# Patient Record
Sex: Male | Born: 1995 | Race: White | Hispanic: No | Marital: Single
Health system: Southern US, Community
[De-identification: ages and names within clinical notes are randomized; demographics above are authoritative.]

## PROBLEM LIST (undated history)

## (undated) DIAGNOSIS — J45909 Unspecified asthma, uncomplicated: Secondary | ICD-10-CM

## (undated) HISTORY — PX: ELBOW SURGERY: SHX618

## (undated) HISTORY — PX: OTHER SURGICAL HISTORY: SHX169

---
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
## 2016-01-02 ENCOUNTER — Ambulatory Visit (INDEPENDENT_AMBULATORY_CARE_PROVIDER_SITE_OTHER): Payer: 59 | Admitting: Psychology

## 2016-01-02 DIAGNOSIS — F321 Major depressive disorder, single episode, moderate: Secondary | ICD-10-CM | POA: Diagnosis not present

## 2016-01-02 DIAGNOSIS — F411 Generalized anxiety disorder: Secondary | ICD-10-CM

## 2016-01-23 ENCOUNTER — Ambulatory Visit (INDEPENDENT_AMBULATORY_CARE_PROVIDER_SITE_OTHER): Payer: 59 | Admitting: Psychology

## 2016-01-23 DIAGNOSIS — F321 Major depressive disorder, single episode, moderate: Secondary | ICD-10-CM

## 2016-01-23 DIAGNOSIS — F900 Attention-deficit hyperactivity disorder, predominantly inattentive type: Secondary | ICD-10-CM | POA: Diagnosis not present

## 2016-01-23 DIAGNOSIS — F411 Generalized anxiety disorder: Secondary | ICD-10-CM | POA: Diagnosis not present

## 2016-02-16 ENCOUNTER — Ambulatory Visit (INDEPENDENT_AMBULATORY_CARE_PROVIDER_SITE_OTHER): Payer: 59 | Admitting: Psychology

## 2016-02-16 DIAGNOSIS — F411 Generalized anxiety disorder: Secondary | ICD-10-CM

## 2016-02-16 DIAGNOSIS — F1210 Cannabis abuse, uncomplicated: Secondary | ICD-10-CM

## 2016-02-16 DIAGNOSIS — F902 Attention-deficit hyperactivity disorder, combined type: Secondary | ICD-10-CM

## 2016-02-16 DIAGNOSIS — F324 Major depressive disorder, single episode, in partial remission: Secondary | ICD-10-CM | POA: Diagnosis not present

## 2016-03-01 ENCOUNTER — Ambulatory Visit (INDEPENDENT_AMBULATORY_CARE_PROVIDER_SITE_OTHER): Payer: 59 | Admitting: Psychology

## 2016-03-01 DIAGNOSIS — F1210 Cannabis abuse, uncomplicated: Secondary | ICD-10-CM | POA: Diagnosis not present

## 2016-03-01 DIAGNOSIS — F902 Attention-deficit hyperactivity disorder, combined type: Secondary | ICD-10-CM

## 2016-03-01 DIAGNOSIS — F410 Panic disorder [episodic paroxysmal anxiety] without agoraphobia: Secondary | ICD-10-CM | POA: Diagnosis not present

## 2016-03-01 DIAGNOSIS — F324 Major depressive disorder, single episode, in partial remission: Secondary | ICD-10-CM | POA: Diagnosis not present

## 2016-03-29 ENCOUNTER — Ambulatory Visit: Payer: 59 | Admitting: Psychology

## 2016-04-02 ENCOUNTER — Ambulatory Visit (INDEPENDENT_AMBULATORY_CARE_PROVIDER_SITE_OTHER): Payer: 59 | Admitting: Psychology

## 2016-04-02 DIAGNOSIS — F410 Panic disorder [episodic paroxysmal anxiety] without agoraphobia: Secondary | ICD-10-CM

## 2016-04-02 DIAGNOSIS — F902 Attention-deficit hyperactivity disorder, combined type: Secondary | ICD-10-CM

## 2016-04-02 DIAGNOSIS — F1210 Cannabis abuse, uncomplicated: Secondary | ICD-10-CM | POA: Diagnosis not present

## 2016-04-16 ENCOUNTER — Ambulatory Visit (INDEPENDENT_AMBULATORY_CARE_PROVIDER_SITE_OTHER): Payer: 59 | Admitting: Psychology

## 2016-04-16 DIAGNOSIS — F410 Panic disorder [episodic paroxysmal anxiety] without agoraphobia: Secondary | ICD-10-CM | POA: Diagnosis not present

## 2016-04-16 DIAGNOSIS — F902 Attention-deficit hyperactivity disorder, combined type: Secondary | ICD-10-CM | POA: Diagnosis not present

## 2016-04-16 DIAGNOSIS — F324 Major depressive disorder, single episode, in partial remission: Secondary | ICD-10-CM

## 2016-05-06 ENCOUNTER — Ambulatory Visit: Payer: 59 | Admitting: Psychology

## 2017-05-04 IMAGING — US US ABDOMEN COMPLETE
1 series · 14 of 25 positions shown · non-contrast
Comparison: None.

CLINICAL DATA: Nausea, vomiting and abdominal pain.

EXAM:
ULTRASOUND ABDOMEN COMPLETE

[Series 1: us abdomen complete · 0.32mm/px · 14 of 79 slices shown]
[im 1/79]
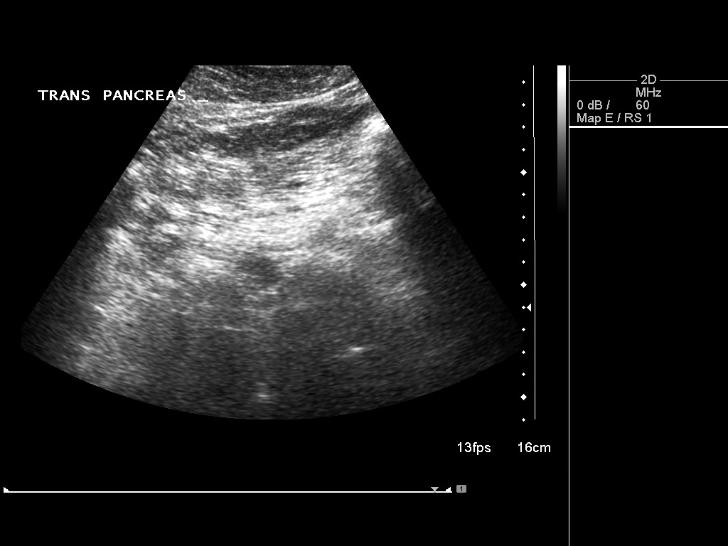
[im 7/79]
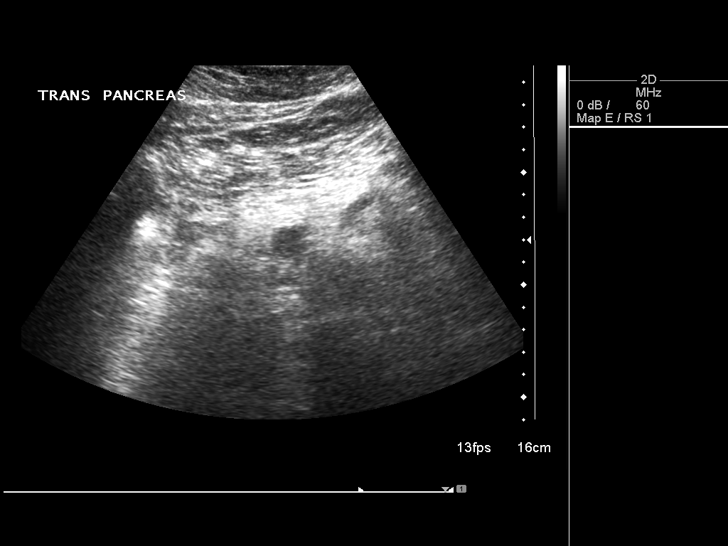
[im 14/79]
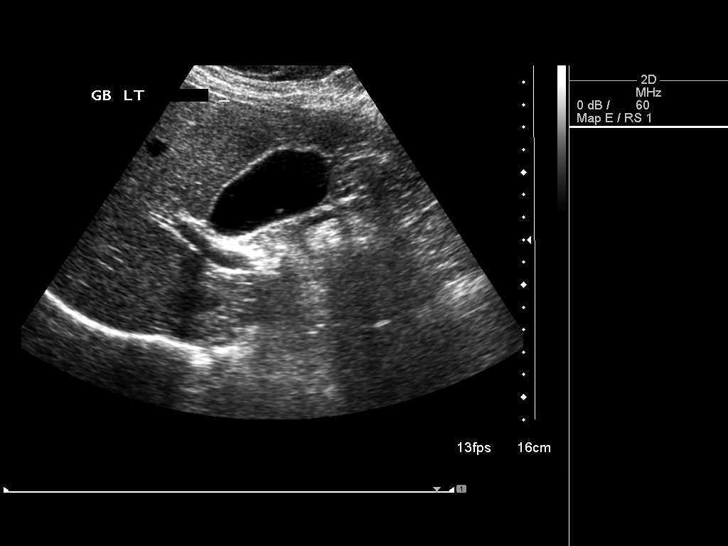
[im 20/79]
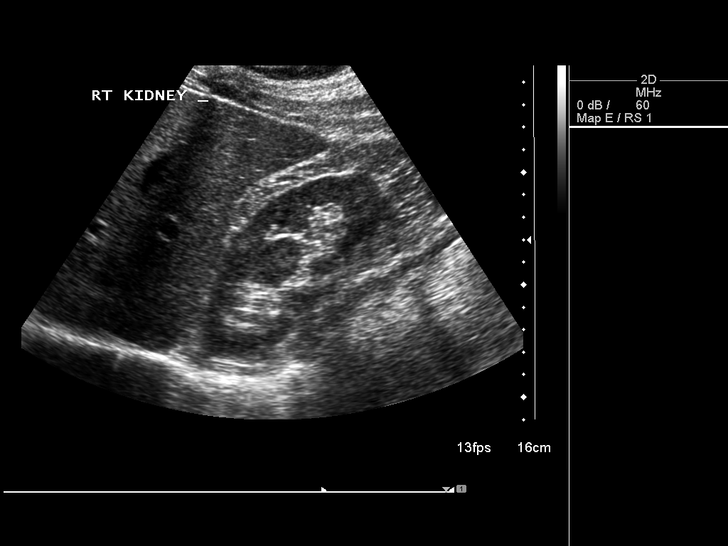
[im 27/79]
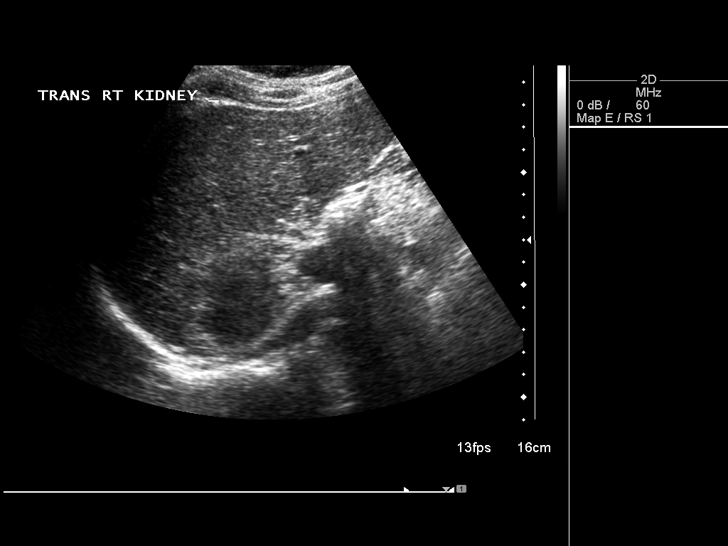
[im 30/79]
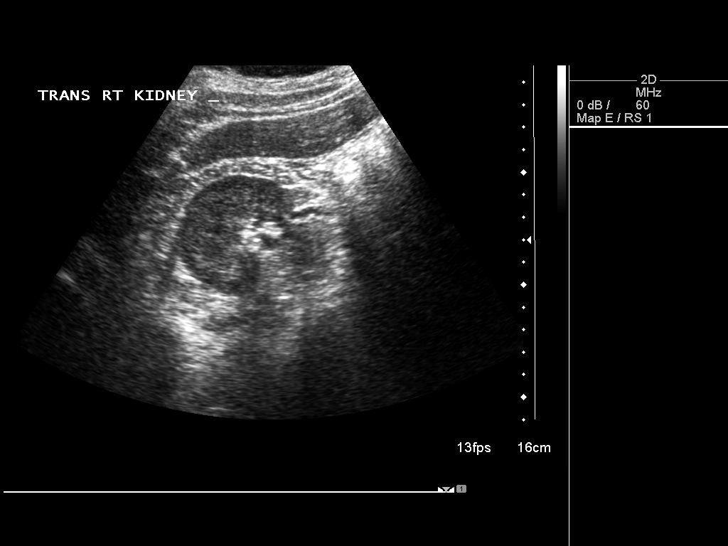
[im 36/79]
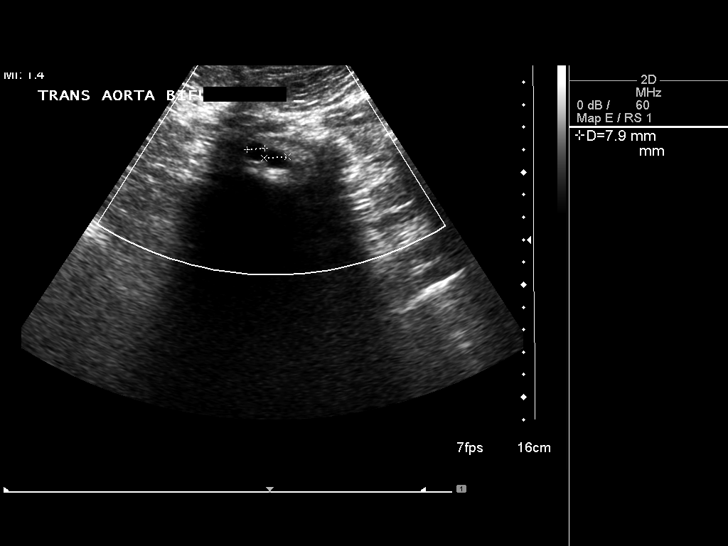
[im 43/79]
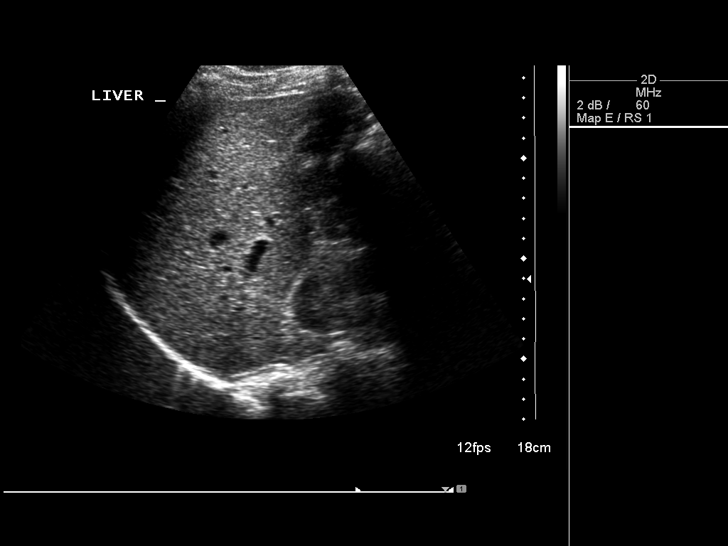
[im 49/79]
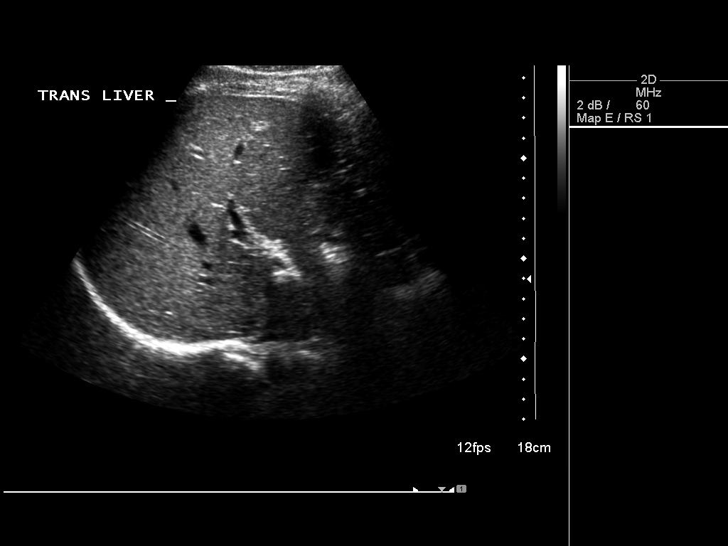
[im 53/79]
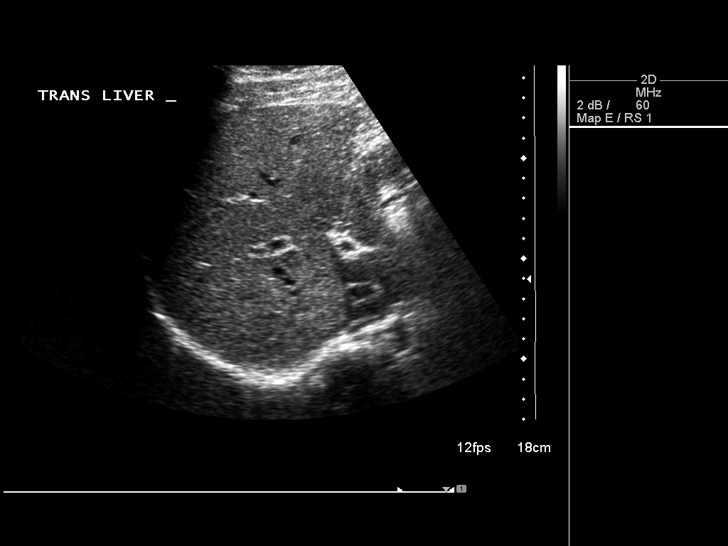
[im 59/79]
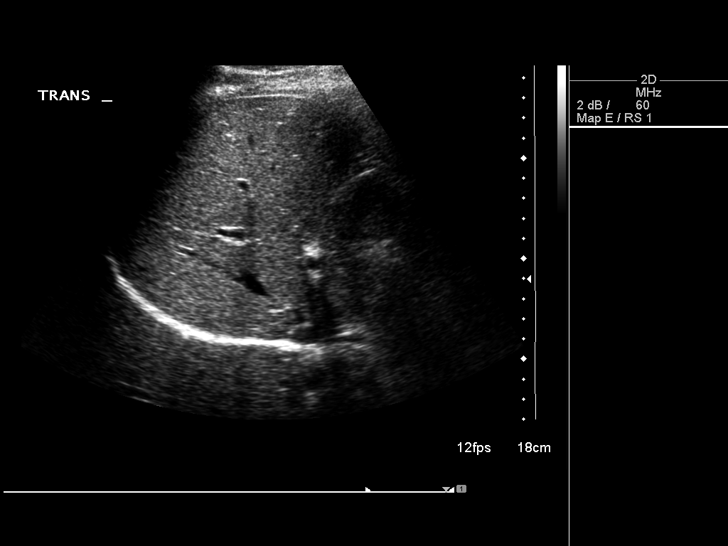
[im 66/79]
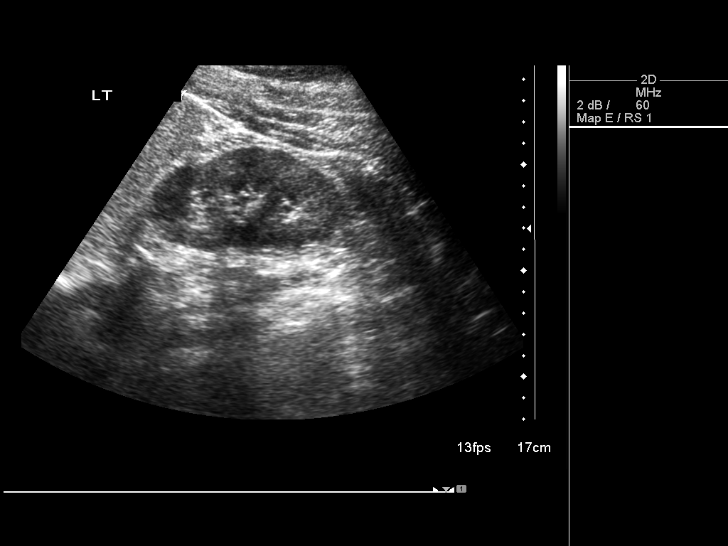
[im 72/79]
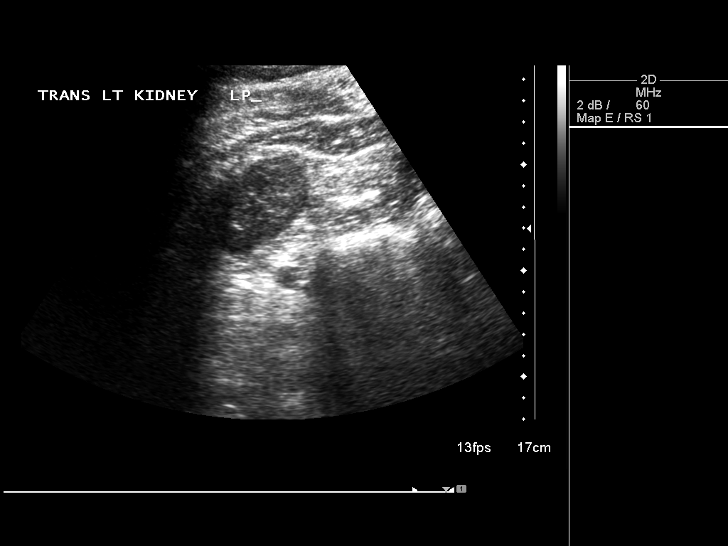
[im 79/79]
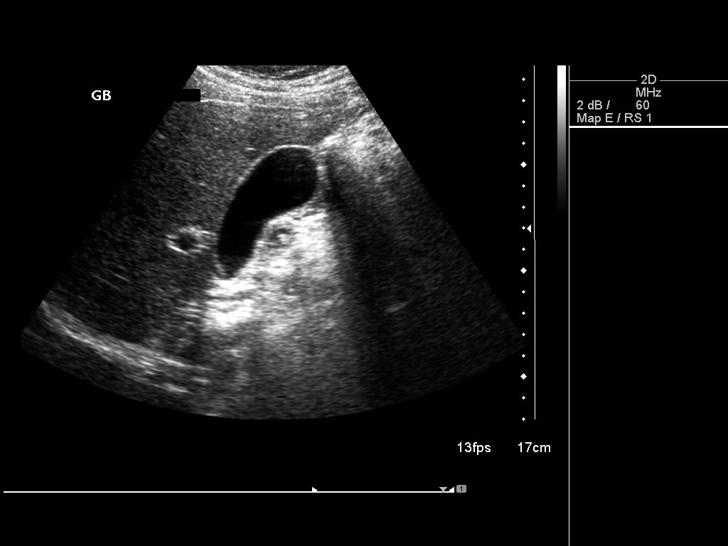

[14 of 25 positions shown; findings below may reference images not displayed]

FINDINGS: Gallbladder: No gallstones or wall thickening visualized. No
sonographic Murphy sign noted.

Common bile duct: Diameter: 3 mm

Liver: No focal lesion identified. Within normal limits in
parenchymal echogenicity.

IVC: No abnormality visualized.

Pancreas: Visualized portion unremarkable.

Spleen: Size and appearance within normal limits.

Right Kidney: Length: 10.8 cm. Echogenicity within normal limits. No
mass or hydronephrosis visualized.

Left Kidney: Length: 11.4 cm. Echogenicity within normal limits. No
mass or hydronephrosis visualized.

Abdominal aorta: No aneurysm visualized.

Other findings: None.
IMPRESSION: Normal abdominal sonogram, with no cholelithiasis.

## 2022-02-22 ENCOUNTER — Emergency Department (HOSPITAL_BASED_OUTPATIENT_CLINIC_OR_DEPARTMENT_OTHER): Payer: BC Managed Care – PPO

## 2022-02-22 ENCOUNTER — Emergency Department (HOSPITAL_COMMUNITY): Payer: BC Managed Care – PPO | Admitting: Anesthesiology

## 2022-02-22 ENCOUNTER — Other Ambulatory Visit: Payer: Self-pay

## 2022-02-22 ENCOUNTER — Encounter (HOSPITAL_COMMUNITY)
Admission: EM | Disposition: A | Discharge status: Home / Self Care | Payer: Self-pay | Source: Home / Self Care | Attending: Emergency Medicine

## 2022-02-22 ENCOUNTER — Encounter (HOSPITAL_BASED_OUTPATIENT_CLINIC_OR_DEPARTMENT_OTHER): Payer: Self-pay | Admitting: Emergency Medicine

## 2022-02-22 ENCOUNTER — Ambulatory Visit (HOSPITAL_BASED_OUTPATIENT_CLINIC_OR_DEPARTMENT_OTHER)
Admission: EM | Admit: 2022-02-22 | Discharge: 2022-02-22 | Disposition: A | Discharge status: Home / Self Care | Payer: BC Managed Care – PPO | Attending: Emergency Medicine | Admitting: Emergency Medicine

## 2022-02-22 DIAGNOSIS — K353 Acute appendicitis with localized peritonitis, without perforation or gangrene: Secondary | ICD-10-CM

## 2022-02-22 DIAGNOSIS — K3580 Unspecified acute appendicitis: Secondary | ICD-10-CM | POA: Diagnosis present

## 2022-02-22 DIAGNOSIS — R590 Localized enlarged lymph nodes: Secondary | ICD-10-CM | POA: Diagnosis not present

## 2022-02-22 HISTORY — DX: Unspecified asthma, uncomplicated: J45.909

## 2022-02-22 HISTORY — PX: LAPAROSCOPIC APPENDECTOMY: SHX408

## 2022-02-22 LAB — COMPREHENSIVE METABOLIC PANEL
ALT: 23 U/L (ref 0–44)
AST: 14 U/L — ABNORMAL LOW (ref 15–41)
Albumin: 5 g/dL (ref 3.5–5.0)
Alkaline Phosphatase: 59 U/L (ref 38–126)
Anion gap: 8 (ref 5–15)
BUN: 13 mg/dL (ref 6–20)
CO2: 28 mmol/L (ref 22–32)
Calcium: 9.6 mg/dL (ref 8.9–10.3)
Chloride: 104 mmol/L (ref 98–111)
Creatinine, Ser: 0.94 mg/dL (ref 0.61–1.24)
GFR, Estimated: >60 mL/min (ref >60)
Glucose, Bld: 107 mg/dL — ABNORMAL HIGH (ref 70–99)
Potassium: 3.9 mmol/L (ref 3.5–5.1)
Sodium: 140 mmol/L (ref 135–145)
Total Bilirubin: 0.7 mg/dL (ref 0.3–1.2)
Total Protein: 7.9 g/dL (ref 6.5–8.1)

## 2022-02-22 LAB — URINALYSIS, ROUTINE W REFLEX MICROSCOPIC
Bilirubin Urine: NEGATIVE
Glucose, UA: NEGATIVE mg/dL
Hgb urine dipstick: NEGATIVE
Ketones, ur: NEGATIVE mg/dL
Leukocytes,Ua: NEGATIVE
Nitrite: NEGATIVE
Protein, ur: NEGATIVE mg/dL
Specific Gravity, Urine: 1.022 (ref 1.005–1.030)
pH: 6 (ref 5.0–8.0)

## 2022-02-22 LAB — CBC
HCT: 47.1 % (ref 39.0–52.0)
Hemoglobin: 16.4 g/dL (ref 13.0–17.0)
MCH: 29.7 pg (ref 26.0–34.0)
MCHC: 34.8 g/dL (ref 30.0–36.0)
MCV: 85.3 fL (ref 80.0–100.0)
Platelets: 192 10*3/uL (ref 150–400)
RBC: 5.52 MIL/uL (ref 4.22–5.81)
RDW: 13.7 % (ref 11.5–15.5)
WBC: 13.8 10*3/uL — ABNORMAL HIGH (ref 4.0–10.5)
nRBC: 0 % (ref 0.0–0.2)

## 2022-02-22 LAB — LIPASE, BLOOD: Lipase: 10 U/L — ABNORMAL LOW (ref 11–51)

## 2022-02-22 SURGERY — APPENDECTOMY, LAPAROSCOPIC
Anesthesia: General

## 2022-02-22 MED ORDER — BUPIVACAINE-EPINEPHRINE (PF) 0.25% -1:200000 IJ SOLN
INTRAMUSCULAR | Status: AC
  Filled 2022-02-22: qty 30

## 2022-02-22 MED ORDER — OXYCODONE HCL 5 MG/5ML PO SOLN: 5.0000 mg | Freq: Once | ORAL | Status: DC | PRN

## 2022-02-22 MED ORDER — SUCCINYLCHOLINE CHLORIDE 200 MG/10ML IV SOSY
PREFILLED_SYRINGE | INTRAVENOUS | Status: DC | PRN
  Administered 2022-02-22: 80 mg via INTRAVENOUS

## 2022-02-22 MED ORDER — MIDAZOLAM HCL 2 MG/2ML IJ SOLN
INTRAMUSCULAR | Status: DC | PRN
  Administered 2022-02-22: 2 mg via INTRAVENOUS

## 2022-02-22 MED ORDER — AMISULPRIDE (ANTIEMETIC) 5 MG/2ML IV SOLN: 10.0000 mg | Freq: Once | INTRAVENOUS | Status: DC | PRN

## 2022-02-22 MED ORDER — BUPIVACAINE-EPINEPHRINE 0.25% -1:200000 IJ SOLN
INTRAMUSCULAR | Status: DC | PRN
  Administered 2022-02-22: 30 mL

## 2022-02-22 MED ORDER — CHLORHEXIDINE GLUCONATE 0.12 % MT SOLN: 15.0000 mL | Freq: Once | OROMUCOSAL | Status: DC

## 2022-02-22 MED ORDER — MIDAZOLAM HCL 2 MG/2ML IJ SOLN
INTRAMUSCULAR | Status: AC
Start: 2022-02-22 — End: ?
  Filled 2022-02-22: qty 2

## 2022-02-22 MED ORDER — LIDOCAINE 2% (20 MG/ML) 5 ML SYRINGE
INTRAMUSCULAR | Status: DC | PRN
  Administered 2022-02-22: 60 mg via INTRAVENOUS

## 2022-02-22 MED ORDER — CHLORHEXIDINE GLUCONATE 0.12 % MT SOLN
OROMUCOSAL | Status: AC
  Filled 2022-02-22: qty 15

## 2022-02-22 MED ORDER — ROCURONIUM BROMIDE 10 MG/ML (PF) SYRINGE
PREFILLED_SYRINGE | INTRAVENOUS | Status: DC | PRN
  Administered 2022-02-22: 50 mg via INTRAVENOUS

## 2022-02-22 MED ORDER — SODIUM CHLORIDE 0.9 % IR SOLN
Status: DC | PRN
  Administered 2022-02-22: 1000 mL

## 2022-02-22 MED ORDER — OXYCODONE HCL 5 MG PO TABS: 5.0000 mg | ORAL_TABLET | Freq: Once | ORAL | Status: DC | PRN

## 2022-02-22 MED ORDER — PROPOFOL 10 MG/ML IV BOLUS
INTRAVENOUS | Status: DC | PRN
  Administered 2022-02-22: 200 mg via INTRAVENOUS

## 2022-02-22 MED ORDER — 0.9 % SODIUM CHLORIDE (POUR BTL) OPTIME
TOPICAL | Status: DC | PRN
  Administered 2022-02-22: 1000 mL

## 2022-02-22 MED ORDER — LACTATED RINGERS IV SOLN: INTRAVENOUS | Status: DC

## 2022-02-22 MED ORDER — OXYCODONE HCL 5 MG PO TABS: 5.0000 mg | ORAL_TABLET | Freq: Four times a day (QID) | ORAL | 0 refills | Status: AC | PRN

## 2022-02-22 MED ORDER — ONDANSETRON HCL 4 MG/2ML IJ SOLN
INTRAMUSCULAR | Status: DC | PRN
  Administered 2022-02-22: 4 mg via INTRAVENOUS

## 2022-02-22 MED ORDER — IBUPROFEN 800 MG PO TABS: 800.0000 mg | ORAL_TABLET | Freq: Three times a day (TID) | ORAL | 0 refills | Status: AC | PRN

## 2022-02-22 MED ORDER — FENTANYL CITRATE (PF) 250 MCG/5ML IJ SOLN
INTRAMUSCULAR | Status: DC | PRN
  Administered 2022-02-22: 150 ug via INTRAVENOUS
  Administered 2022-02-22 (×2): 50 ug via INTRAVENOUS

## 2022-02-22 MED ORDER — ORAL CARE MOUTH RINSE: 15.0000 mL | Freq: Once | OROMUCOSAL | Status: DC

## 2022-02-22 MED ORDER — SODIUM CHLORIDE 0.9 % IV SOLN
2.0000 g | Freq: Once | INTRAVENOUS | Status: AC
  Administered 2022-02-22: 2 g via INTRAVENOUS
  Filled 2022-02-22: qty 20

## 2022-02-22 MED ORDER — FENTANYL CITRATE (PF) 100 MCG/2ML IJ SOLN: 25.0000 ug | INTRAMUSCULAR | Status: DC | PRN

## 2022-02-22 MED ORDER — SUGAMMADEX SODIUM 200 MG/2ML IV SOLN
INTRAVENOUS | Status: DC | PRN
  Administered 2022-02-22: 200 mg via INTRAVENOUS

## 2022-02-22 MED ORDER — ACETAMINOPHEN 325 MG PO TABS
650.0000 mg | ORAL_TABLET | Freq: Once | ORAL | Status: AC
  Administered 2022-02-22: 650 mg via ORAL
  Filled 2022-02-22: qty 2

## 2022-02-22 MED ORDER — KETOROLAC TROMETHAMINE 30 MG/ML IJ SOLN
30.0000 mg | Freq: Once | INTRAMUSCULAR | Status: AC | PRN
  Administered 2022-02-22: 30 mg via INTRAVENOUS

## 2022-02-22 MED ORDER — FENTANYL CITRATE (PF) 250 MCG/5ML IJ SOLN
INTRAMUSCULAR | Status: AC
  Filled 2022-02-22: qty 5

## 2022-02-22 MED ORDER — ONDANSETRON HCL 4 MG/2ML IJ SOLN
4.0000 mg | Freq: Four times a day (QID) | INTRAMUSCULAR | Status: DC
  Filled 2022-02-22: qty 2

## 2022-02-22 MED ORDER — PROMETHAZINE HCL 25 MG/ML IJ SOLN: 6.2500 mg | INTRAMUSCULAR | Status: DC | PRN

## 2022-02-22 MED ORDER — KETOROLAC TROMETHAMINE 30 MG/ML IJ SOLN
INTRAMUSCULAR | Status: AC
  Filled 2022-02-22: qty 1

## 2022-02-22 MED ORDER — IOHEXOL 300 MG/ML  SOLN
100.0000 mL | Freq: Once | INTRAMUSCULAR | Status: AC | PRN
  Administered 2022-02-22: 100 mL via INTRAVENOUS

## 2022-02-22 MED ORDER — DEXAMETHASONE SODIUM PHOSPHATE 10 MG/ML IJ SOLN
INTRAMUSCULAR | Status: DC | PRN
  Administered 2022-02-22: 10 mg via INTRAVENOUS

## 2022-02-22 MED ORDER — METRONIDAZOLE 500 MG/100ML IV SOLN
500.0000 mg | Freq: Once | INTRAVENOUS | Status: AC
  Administered 2022-02-22: 500 mg via INTRAVENOUS
  Filled 2022-02-22: qty 100

## 2022-02-22 MED ORDER — DEXMEDETOMIDINE (PRECEDEX) IN NS 20 MCG/5ML (4 MCG/ML) IV SYRINGE
PREFILLED_SYRINGE | INTRAVENOUS | Status: DC | PRN
  Administered 2022-02-22 (×2): 8 ug via INTRAVENOUS
  Administered 2022-02-22: 4 ug via INTRAVENOUS

## 2022-02-22 SURGICAL SUPPLY — 50 items
ADH SKN CLS APL DERMABOND .7 (GAUZE/BANDAGES/DRESSINGS) ×1
ADH SKN CLS LQ APL DERMABOND (GAUZE/BANDAGES/DRESSINGS) ×1
APL PRP STRL LF DISP 70% ISPRP (MISCELLANEOUS) ×1
APPLIER CLIP 5 13 M/L LIGAMAX5 (MISCELLANEOUS)
APR CLP MED LRG 5 ANG JAW (MISCELLANEOUS)
BAG COUNTER SPONGE SURGICOUNT (BAG) ×2 IMPLANT
BAG SPEC RTRVL 10 TROC 200 (ENDOMECHANICALS) ×1
BAG SPNG CNTER NS LX DISP (BAG) ×1
CANISTER SUCT 3000ML PPV (MISCELLANEOUS) ×2 IMPLANT
CHLORAPREP W/TINT 26 (MISCELLANEOUS) ×2 IMPLANT
CLIP APPLIE 5 13 M/L LIGAMAX5 (MISCELLANEOUS) IMPLANT
CLIP LIGATING HEMO LOK XL GOLD (MISCELLANEOUS) ×2 IMPLANT
COVER SURGICAL LIGHT HANDLE (MISCELLANEOUS) ×2 IMPLANT
CUTTER FLEX LINEAR 45M (STAPLE) ×1 IMPLANT
DERMABOND ADHESIVE PROPEN (GAUZE/BANDAGES/DRESSINGS) ×1
DERMABOND ADVANCED (GAUZE/BANDAGES/DRESSINGS) ×1
DERMABOND ADVANCED .7 DNX12 (GAUZE/BANDAGES/DRESSINGS) ×1 IMPLANT
DERMABOND ADVANCED .7 DNX6 (GAUZE/BANDAGES/DRESSINGS) IMPLANT
ELECT REM PT RETURN 9FT ADLT (ELECTROSURGICAL) ×2
ELECTRODE REM PT RTRN 9FT ADLT (ELECTROSURGICAL) ×1 IMPLANT
ENDOLOOP SUT PDS II  0 18 (SUTURE)
ENDOLOOP SUT PDS II 0 18 (SUTURE) IMPLANT
GLOVE BIOGEL PI IND STRL 7.0 (GLOVE) ×1 IMPLANT
GLOVE BIOGEL PI INDICATOR 7.0 (GLOVE) ×1
GLOVE SURG SS PI 7.0 STRL IVOR (GLOVE) ×2 IMPLANT
GOWN STRL REUS W/ TWL LRG LVL3 (GOWN DISPOSABLE) ×3 IMPLANT
GOWN STRL REUS W/TWL LRG LVL3 (GOWN DISPOSABLE) ×6
GRASPER SUT TROCAR 14GX15 (MISCELLANEOUS) ×2 IMPLANT
KIT BASIN OR (CUSTOM PROCEDURE TRAY) ×2 IMPLANT
KIT TURNOVER KIT B (KITS) ×2 IMPLANT
NEEDLE 22X1 1/2 (OR ONLY) (NEEDLE) ×2 IMPLANT
NS IRRIG 1000ML POUR BTL (IV SOLUTION) ×2 IMPLANT
PAD ARMBOARD 7.5X6 YLW CONV (MISCELLANEOUS) ×4 IMPLANT
POUCH RETRIEVAL ECOSAC 10 (ENDOMECHANICALS) ×1 IMPLANT
POUCH RETRIEVAL ECOSAC 10MM (ENDOMECHANICALS) ×2
RELOAD STAPLE 45 3.5 BLU ETS (ENDOMECHANICALS) IMPLANT
RELOAD STAPLE TA45 3.5 REG BLU (ENDOMECHANICALS) ×2 IMPLANT
SCISSORS LAP 5X35 DISP (ENDOMECHANICALS) ×2 IMPLANT
SET IRRIG TUBING LAPAROSCOPIC (IRRIGATION / IRRIGATOR) ×2 IMPLANT
SET TUBE SMOKE EVAC HIGH FLOW (TUBING) ×2 IMPLANT
SLEEVE ENDOPATH XCEL 5M (ENDOMECHANICALS) ×2 IMPLANT
SPECIMEN JAR SMALL (MISCELLANEOUS) ×2 IMPLANT
SUT MNCRL AB 4-0 PS2 18 (SUTURE) ×2 IMPLANT
TOWEL GREEN STERILE (TOWEL DISPOSABLE) ×2 IMPLANT
TOWEL GREEN STERILE FF (TOWEL DISPOSABLE) ×2 IMPLANT
TRAY FOLEY W/BAG SLVR 14FR (SET/KITS/TRAYS/PACK) IMPLANT
TRAY LAPAROSCOPIC MC (CUSTOM PROCEDURE TRAY) ×2 IMPLANT
TROCAR XCEL 12X100 BLDLESS (ENDOMECHANICALS) ×2 IMPLANT
TROCAR Z-THREAD OPTICAL 5X100M (TROCAR) ×2 IMPLANT
WATER STERILE IRR 1000ML POUR (IV SOLUTION) ×2 IMPLANT

## 2022-02-22 NOTE — Op Note (Signed)
Preoperative diagnosis: acute appendicitis  Postoperative diagnosis: Same   Procedure: laparoscopic appendectomy  Surgeon: Feliciana Rossetti, M.D.  Asst: none  Anesthesia: Gen.   Indications for procedure: Corey Short is a 26 y.o. male with symptoms of constipation, pain in right lower quadrant, and nausea consistent with acute appendicitis. Confirmed by CT.  Description of procedure: The patient was brought into the operative suite, placed supine. Anesthesia was administered with endotracheal tube. The patient's left arm was tucked. All pressure points were offloaded by foam padding. The patient was prepped and draped in the usual sterile fashion.  A transverse incision was made to the left of the umbilicus and a 12mm trocar was [place]. Pneumoperitoneum was applied with high flow low pressure.  2 26mm trocars were placed, one in the suprapubic space, one in the LLQ, the periumbilical incision was then up-sized and a 33mm trocar placed in that space. A transversus abdominal block was placed on the left and right sides. Next, the patient was placed in trendelenberg, rotated to the left. The omentum was retracted cephalad. The cecum and appendix were identified. The appendix was dilated but not perforated. The base of the appendix was dissected and a window through the mesoappendix was created with blunt dissection. A 76mm blue load stapler was used to cut the appendix at its base. Next a hemolock clip was placed across the mesoappendix and the mesoappendix was cut with cautery.  The appendix was placed in a specimen bag. The pelvis and RLQ were irrigated. No murky fluid was seen in the pelvis. The appendix was removed via the 12 mm trocar. 0 vicryl was used to close the fascial defect. Pneumoperitoneum was removed, all trocars were removed. All incisions were closed with 4-0 monocryl subcuticular stitch. The patient woke from anesthesia and was brought to PACU in stable condition.  Findings: acute  appendicitis  Specimen: appendix  Blood loss: 10 ml  Local anesthesia: 30 ml Marcaine  Complications: none  Feliciana Rossetti, M.D. General, Bariatric, & Minimally Invasive Surgery [facility]

## 2022-02-22 NOTE — Anesthesia Preprocedure Evaluation (Addendum)
Anesthesia Evaluation  Patient identified by MRN, date of birth, ID band Patient awake    Reviewed: Allergy & Precautions, NPO status , Patient's Chart, lab work & pertinent test results  Airway Mallampati: I  TM Distance: >3 FB Neck ROM: Full    Dental no notable dental hx.    Pulmonary asthma ,    Pulmonary exam normal        Cardiovascular negative cardio ROS Normal cardiovascular exam     Neuro/Psych negative neurological ROS  negative psych ROS   GI/Hepatic negative GI ROS, Neg liver ROS,   Endo/Other  negative endocrine ROS  Renal/GU negative Renal ROS     Musculoskeletal negative musculoskeletal ROS (+)   Abdominal   Peds  Hematology negative hematology ROS (+)   Anesthesia Other Findings Acute Appendcitis  Reproductive/Obstetrics                            Anesthesia Physical Anesthesia Plan  ASA: 1 and emergent  Anesthesia Plan: General   Post-op Pain Management:    Induction: Intravenous  PONV Risk Score and Plan: 3 and Ondansetron, Dexamethasone, Midazolam and Treatment may vary due to age or medical condition  Airway Management Planned: Oral ETT  Additional Equipment:   Intra-op Plan:   Post-operative Plan: Extubation in OR  Informed Consent:     Dental advisory given  Plan Discussed with: CRNA  Anesthesia Plan Comments:        Anesthesia Quick Evaluation

## 2022-02-22 NOTE — H&P (Signed)
Reason for Consult:abdominal pain Referring Provider: [name] is an 26 y.o. male.  HPI: 26 yo male with 1 day of acute intense pain. Pain is diffuse but worse in the right lower quadrant. He had nausea earlier today. He denies diarrhea or fevers.  Past Medical History:  Diagnosis Date   Asthma     Past Surgical History:  Procedure Laterality Date   cancerous mole head removed      ELBOW SURGERY      History reviewed. No pertinent family history.  Social History:  has no history on file for tobacco use, alcohol use, and drug use.  Allergies: No Known Allergies  Medications: I have reviewed the patient's current medications.  Results for orders placed or performed during the hospital encounter of 02/22/22 (from the past 48 hour(s))  Lipase, blood     Status: Abnormal   Collection Time: 02/22/22  3:18 PM  Result Value Ref Range   Lipase 10 (L) 11 - 51 U/L    Comment: Performed at [facility], [address] [place], [place] [address]  Comprehensive metabolic panel     Status: Abnormal   Collection Time: 02/22/22  3:18 PM  Result Value Ref Range   Sodium 140 135 - 145 mmol/L   Potassium 3.9 3.5 - 5.1 mmol/L   Chloride 104 98 - 111 mmol/L   CO2 28 22 - 32 mmol/L   Glucose, Bld 107 (H) 70 - 99 mg/dL    Comment: Glucose reference range applies only to samples taken after fasting for at least 8 hours.   BUN 13 6 - 20 mg/dL   Creatinine, Ser 0.93 0.61 - 1.24 mg/dL   Calcium 9.6 8.9 - 81.8 mg/dL   Total Protein 7.9 6.5 - 8.1 g/dL   Albumin 5.0 3.5 - 5.0 g/dL   AST 14 (L) 15 - 41 U/L   ALT 23 0 - 44 U/L   Alkaline Phosphatase 59 38 - 126 U/L   Total Bilirubin 0.7 0.3 - 1.2 mg/dL   GFR, Estimated >29 >93 mL/min    Comment: (NOTE) Calculated using the CKD-EPI Creatinine Equation (2021)    Anion gap 8 5 - 15    Comment: Performed at [facility], [address], [place], [place] [address]  CBC      Status: Abnormal   Collection Time: 02/22/22  3:18 PM  Result Value Ref Range   WBC 13.8 (H) 4.0 - 10.5 K/uL   RBC 5.52 4.22 - 5.81 MIL/uL   Hemoglobin 16.4 13.0 - 17.0 g/dL   HCT 78.9 38.1 - 01.7 %   MCV 85.3 80.0 - 100.0 fL   MCH 29.7 26.0 - 34.0 pg   MCHC 34.8 30.0 - 36.0 g/dL   RDW 51.0 25.8 - 52.7 %   Platelets 192 150 - 400 K/uL   nRBC 0.0 0.0 - 0.2 %    Comment: Performed at [facility], [address], [place], [place] [address]  Urinalysis, Routine w reflex microscopic     Status: None   Collection Time: 02/22/22  3:18 PM  Result Value Ref Range   Color, Urine YELLOW YELLOW   APPearance CLEAR CLEAR   Specific Gravity, Urine 1.022 1.005 - 1.030   pH 6.0 5.0 - 8.0   Glucose, UA NEGATIVE NEGATIVE mg/dL   Hgb urine dipstick NEGATIVE NEGATIVE   Bilirubin Urine NEGATIVE NEGATIVE   Ketones, ur NEGATIVE NEGATIVE mg/dL   Protein, ur NEGATIVE NEGATIVE  mg/dL   Nitrite NEGATIVE NEGATIVE   Leukocytes,Ua NEGATIVE NEGATIVE    Comment: Performed at [facility], [address], [place], [place] [address]    CT ABDOMEN PELVIS W CONTRAST  Result Date: 02/22/2022 CLINICAL DATA:  Right lower quadrant abdominal pain. EXAM: CT ABDOMEN AND PELVIS WITH CONTRAST TECHNIQUE: Multidetector CT imaging of the abdomen and pelvis was performed using the standard protocol following bolus administration of intravenous contrast. RADIATION DOSE REDUCTION: This exam was performed according to the departmental dose-optimization program which includes automated exposure control, adjustment of the mA and/or kV according to patient size and/or use of iterative reconstruction technique. CONTRAST:  OMNIPAQUE IOHEXOL 300 MG/ML  SOLN COMPARISON:  June 11, 2015 FINDINGS: Lower chest: The visualized bibasilar lungs are clear. Normal heart size no pericardial effusion. Hepatobiliary: Minimal focal fat deposition adjacent to the false form ligament. No gallstones,  gallbladder wall thickening, or biliary dilatation. Pancreas: Unremarkable. No pancreatic ductal dilatation or surrounding inflammatory changes. Spleen: Normal in size without focal abnormality. Adrenals/Urinary Tract: Adrenal glands are unremarkable. Kidneys are normal, without renal calculi, focal lesion, or hydronephrosis. Bladder is unremarkable. Stomach/Bowel: Stomach is within normal limits. Dilated appendix measuring up to 17 mm with wall thickening and surrounding fat stranding. No adjacent fluid collection or free air. No radiopaque appendicolith. No bowel obstruction. Vascular/Lymphatic: No significant vascular findings are present. No enlarged abdominal or pelvic lymph nodes. Reproductive: Prostate is unremarkable. Other: No abdominal wall hernia or abnormality. No abdominopelvic ascites. Musculoskeletal: No acute or significant osseous findings. IMPRESSION: Acute uncomplicated appendicitis. No evidence of perforation or fluid collection. Electronically Signed   By: Jacob Moores M.D.   On: 02/22/2022 16:34    ROS  PE Blood pressure (!) 133/92, pulse 96, temperature 98 F (36.7 C), temperature source Oral, resp. rate 16, height 5' 11.5" (1.816 m), weight 97.5 kg, SpO2 96 %. Constitutional: NAD; conversant; no deformities Eyes: Moist conjunctiva; no lid lag; anicteric; PERRL Neck: Trachea midline; no thyromegaly Lungs: Normal respiratory effort; no tactile fremitus CV: RRR; no palpable thrills; no pitting edema GI: Abd soft, TTP right lower quadrant and left lower quadrant; no palpable hepatosplenomegaly MSK: Normal gait; no clubbing/cyanosis Psychiatric: Appropriate affect; alert and oriented x3 Lymphatic: No palpable cervical or axillary lymphadenopathy Skin: No major subcutaneous nodules. Warm and dry   Assessment/Plan: 26 yo male with acute appendicitis -IV abx -we discussed the details of the procedure; that it would be done under general anesthesia, that we would attempt to do  the procedure laparoscopically. That the appendix would be isolated from the large and small intestine and then ligated and removed. We discussed the reason for this is to avoid rupture and infection and resolve the pains. We discussed risks of infection, abscess, injury to intestines or urinary structures, and need for open incision. He showed good understanding and wanted to proceed. -OR for lap appendectomy  I reviewed last 24 h vitals and pain scores, last 48 h intake and output, last 24 h labs and trends, and last 24 h imaging results.  This care required high  level of medical decision making.   De Blanch Corey Short 02/22/2022, 6:52 PM

## 2022-02-22 NOTE — ED Triage Notes (Signed)
Pt woke up with generalized abd pain. Pain worse in RLQ when laying flat. Denies n/v/d.

## 2022-02-22 NOTE — ED Notes (Signed)
Patient is going by POV over to [facility] short stay. EDP given consent. This RN spoke with short stay and are aware. Patients IV saline locked and wrapped with coban for transport. Patients parents driving patient directly to [facility].

## 2022-02-22 NOTE — Transfer of Care (Signed)
Immediate Anesthesia Transfer of Care Note  Patient: Corey Short  Procedure(s) Performed: APPENDECTOMY LAPAROSCOPIC  Patient Location: PACU  Anesthesia Type:General  Level of Consciousness: awake, alert  and oriented  Airway & Oxygen Therapy: Patient Spontanous Breathing  Post-op Assessment: Report given to RN, Post -op Vital signs reviewed and stable and Patient moving all extremities X 4  Post vital signs: Reviewed and stable  Last Vitals:  Vitals Value Taken Time  BP 155/104 02/22/22 2009  Temp    Pulse 107 02/22/22 2012  Resp 20 02/22/22 2010  SpO2 93 % 02/22/22 2012  Vitals shown include unvalidated device data.  Last Pain:  Vitals:   02/22/22 1800  TempSrc: Oral  PainSc: 2          Complications: No notable events documented.

## 2022-02-22 NOTE — ED Provider Notes (Signed)
[facility] EMERGENCY DEPT Provider Note   CSN: [id number] Arrival date & time: 02/22/22  1507     History  Chief Complaint  Patient presents with   Abdominal Pain    Corey Short is a 26 y.o. male with past medical history significant for some vague generalized abdominal pain on and off for the last 2 years who presents with concern for generalized abdominal pain this morning that is now localized to the right lower quadrant, worse with laying flat.  He denies nausea, vomiting, diarrhea, fever, chills but does endorse some lack of appetite.  No previous history of intra-abdominal surgery, he does still have his appendix.  He has not taken anything for pain prior to arrival.   Abdominal Pain      Home Medications Prior to Admission medications   Medication Sig Start Date End Date Taking? Authorizing Provider  ibuprofen (ADVIL,MOTRIN) 200 MG tablet Take 400 mg by mouth every 6 (six) hours as needed. Pain    [provider]      Allergies    Patient has no known allergies.    Review of Systems   Review of Systems  Gastrointestinal:  Positive for abdominal pain.  All other systems reviewed and are negative.   Physical Exam Updated Vital Signs BP (!) 148/93   Pulse 95   Temp 98.2 F (36.8 C) (Oral)   Resp 16   Ht 5' 11.5" (1.816 m)   Wt 97.5 kg   SpO2 99%   BMI 29.57 kg/m  Physical Exam Vitals and nursing note reviewed.  Constitutional:      General: He is not in acute distress.    Appearance: Normal appearance.  HENT:     Head: Normocephalic and atraumatic.  Eyes:     General:        Right eye: No discharge.        Left eye: No discharge.  Cardiovascular:     Rate and Rhythm: Normal rate and regular rhythm.     Heart sounds: No murmur heard.    No friction rub. No gallop.  Pulmonary:     Effort: Pulmonary effort is normal.     Breath sounds: Normal breath sounds.  Abdominal:     General: Bowel sounds are normal.      Palpations: Abdomen is soft.     Comments: Focally tender in right lower quadrant, positive McBurney's point tenderness.  No rebound, rigidity, guarding.  Skin:    General: Skin is warm and dry.     Capillary Refill: Capillary refill takes less than 2 seconds.  Neurological:     Mental Status: He is alert and oriented to person, place, and time.  Psychiatric:        Mood and Affect: Mood normal.        Behavior: Behavior normal.     ED Results / Procedures / Treatments   Labs (all labs ordered are listed, but only abnormal results are displayed) Labs Reviewed  LIPASE, BLOOD - Abnormal; Notable for the following components:      Result Value   Lipase 10 (*)    All other components within normal limits  COMPREHENSIVE METABOLIC PANEL - Abnormal; Notable for the following components:   Glucose, Bld 107 (*)    AST 14 (*)    All other components within normal limits  CBC - Abnormal; Notable for the following components:   WBC 13.8 (*)    All other components within normal limits  URINALYSIS,  ROUTINE W REFLEX MICROSCOPIC    EKG None  Radiology CT ABDOMEN PELVIS W CONTRAST  Result Date: 02/22/2022 CLINICAL DATA:  Right lower quadrant abdominal pain. EXAM: CT ABDOMEN AND PELVIS WITH CONTRAST TECHNIQUE: Multidetector CT imaging of the abdomen and pelvis was performed using the standard protocol following bolus administration of intravenous contrast. RADIATION DOSE REDUCTION: This exam was performed according to the departmental dose-optimization program which includes automated exposure control, adjustment of the mA and/or kV according to patient size and/or use of iterative reconstruction technique. CONTRAST:  OMNIPAQUE IOHEXOL 300 MG/ML  SOLN COMPARISON:  June 11, 2015 FINDINGS: Lower chest: The visualized bibasilar lungs are clear. Normal heart size no pericardial effusion. Hepatobiliary: Minimal focal fat deposition adjacent to the false form ligament. No gallstones,  gallbladder wall thickening, or biliary dilatation. Pancreas: Unremarkable. No pancreatic ductal dilatation or surrounding inflammatory changes. Spleen: Normal in size without focal abnormality. Adrenals/Urinary Tract: Adrenal glands are unremarkable. Kidneys are normal, without renal calculi, focal lesion, or hydronephrosis. Bladder is unremarkable. Stomach/Bowel: Stomach is within normal limits. Dilated appendix measuring up to 17 mm with wall thickening and surrounding fat stranding. No adjacent fluid collection or free air. No radiopaque appendicolith. No bowel obstruction. Vascular/Lymphatic: No significant vascular findings are present. No enlarged abdominal or pelvic lymph nodes. Reproductive: Prostate is unremarkable. Other: No abdominal wall hernia or abnormality. No abdominopelvic ascites. Musculoskeletal: No acute or significant osseous findings. IMPRESSION: Acute uncomplicated appendicitis. No evidence of perforation or fluid collection. Electronically Signed   By: Jacob Moores M.D.   On: 02/22/2022 16:34    Procedures Procedures    Medications Ordered in ED Medications  ondansetron (ZOFRAN) injection 4 mg (0 mg Intravenous Hold 02/22/22 1709)  cefTRIAXone (ROCEPHIN) 2 g in sodium chloride 0.9 % 100 mL IVPB (0 g Intravenous Stopped 02/22/22 1710)    And  metroNIDAZOLE (FLAGYL) IVPB 500 mg (500 mg Intravenous New Bag/Given 02/22/22 1711)  acetaminophen (TYLENOL) tablet 650 mg (650 mg Oral Given 02/22/22 1548)  iohexol (OMNIPAQUE) 300 MG/ML solution 100 mL (100 mLs Intravenous Contrast Given 02/22/22 1613)    ED Course/ Medical Decision Making/ A&P                           Medical Decision Making Amount and/or Complexity of Data Reviewed Labs: ordered. Radiology: ordered.  Risk OTC drugs. Prescription drug management.   This patient is a 26 y.o. male who presents to the ED for concern of a lysed abdominal pain that localized to the right lower quadrant, this involves an  extensive number of treatment options, and is a complaint that carries with it a high risk of complications and morbidity. The emergent differential diagnosis prior to evaluation includes, but is not limited to, appendicitis, enteritis, gastroenteritis, right-sided diverticulitis, constipation, viral illness, UTI, vascular compromise of the intestines versus other.   This is not an exhaustive differential.   Past Medical History / Co-morbidities / Social History: Some chronic on and off abdominal pain for the last 2 years, no other significant past medical history, no intra-abdominal surgeries previously  Additional history: Chart reviewed. Pertinent results include: Patient with no recent emergency department or outpatient history to review  Physical Exam: Physical exam performed. The pertinent findings include: Patient with focal McBurney's point tenderness on exam, no rebound, rigidity, guarding he is nontoxic-appearing, he has minimal hypertension likely secondary to pain, blood pressure 148/97, afebrile at this time  Lab Tests: I ordered, and personally interpreted labs.  The pertinent results include: UA is unremarkable, lipase negative, CMP with very mild hyperglycemia, glucose 107, nonfasting lab value.  CBC with moderate leukocytosis, blood cells 13.8.  In context of his focal right lower quadrant pain and leukocytosis have a high index of suspicion for appendicitis and will obtain CT at this time.  Patient understands and agrees to this plan   Imaging Studies: I ordered imaging studies including CT abdomen pelvis with contrast. I independently visualized and interpreted imaging which showed acute uncomplicated appendicitis. I agree with the radiologist interpretation.  Medications: I ordered medication including Tylenol for pain for Rocephin, Flagyl for appendicitis.  Patient continues to have manageable pain at this time, will require reevaluation as his condition  progresses.  Consultations Obtained: I requested consultation with the general surgeon, spoke with  Dr Sheliah Hatch,  and discussed lab and imaging findings as well as pertinent plan - they recommend: Admission for appendectomy   Disposition: After consideration of the diagnostic results and the patients response to treatment, I feel that patient will need appendectomy as discussed above..   I discussed this case with my attending physician Dr. Adela Lank who cosigned this note including patient's presenting symptoms, physical exam, and planned diagnostics and interventions. Attending physician stated agreement with plan or made changes to plan which were implemented.    Final Clinical Impression(s) / ED Diagnoses Final diagnoses:  None    Rx / DC Orders ED Discharge Orders     None         Olene Floss, PA-C 02/22/22 1734    Melene Plan, DO 02/22/22 1820

## 2022-02-22 NOTE — Anesthesia Procedure Notes (Signed)
Procedure Name: Intubation Date/Time: 02/22/2022 7:12 PM  Performed by: Rande Brunt, CRNAPre-anesthesia Checklist: Patient identified, Emergency Drugs available, Suction available and Patient being monitored Patient Re-evaluated:Patient Re-evaluated prior to induction Oxygen Delivery Method: Circle System Utilized Preoxygenation: Pre-oxygenation with 100% oxygen Induction Type: IV induction, Rapid sequence and Cricoid Pressure applied Laryngoscope Size: Mac and 4 Grade View: Grade I Tube type: Oral Tube size: 7.5 mm Number of attempts: 1 Airway Equipment and Method: Stylet and Oral airway Placement Confirmation: ETT inserted through vocal cords under direct vision, positive ETCO2 and breath sounds checked- equal and bilateral Secured at: 22 cm Tube secured with: Tape Dental Injury: Teeth and Oropharynx as per pre-operative assessment

## 2022-02-23 ENCOUNTER — Encounter (HOSPITAL_COMMUNITY): Payer: Self-pay | Admitting: General Surgery

## 2022-02-23 NOTE — Anesthesia Postprocedure Evaluation (Signed)
Anesthesia Post Note  Patient: ARVLE GRABE  Procedure(s) Performed: APPENDECTOMY LAPAROSCOPIC     Patient location during evaluation: PACU Anesthesia Type: General Level of consciousness: awake Pain management: pain level controlled Vital Signs Assessment: post-procedure vital signs reviewed and stable Respiratory status: spontaneous breathing, nonlabored ventilation, respiratory function stable and patient connected to nasal cannula oxygen Cardiovascular status: blood pressure returned to baseline and stable Postop Assessment: no apparent nausea or vomiting Anesthetic complications: no   No notable events documented.  Last Vitals:  Vitals:   02/22/22 2038 02/22/22 2051  BP: (!) 145/98 (!) 142/92  Pulse: 91 (!) 113  Resp: 10 17  Temp:  36.7 C  SpO2: 99% 100%    Last Pain:  Vitals:   02/22/22 2051  TempSrc:   PainSc: 2                  Maalle Starrett P Markavious Micco

## 2022-02-24 LAB — SURGICAL PATHOLOGY
